# Patient Record
Sex: Male | Born: 1985 | Race: White | Hispanic: No | Marital: Single
Health system: Southern US, Community
[De-identification: ages and names within clinical notes are randomized; demographics above are authoritative.]

## PROBLEM LIST (undated history)

## (undated) HISTORY — PX: SHOULDER SURGERY: SHX246

---

## 2017-03-13 ENCOUNTER — Encounter (HOSPITAL_COMMUNITY): Payer: Self-pay

## 2017-03-13 DIAGNOSIS — Y998 Other external cause status: Secondary | ICD-10-CM | POA: Diagnosis not present

## 2017-03-13 DIAGNOSIS — Y9289 Other specified places as the place of occurrence of the external cause: Secondary | ICD-10-CM | POA: Diagnosis not present

## 2017-03-13 DIAGNOSIS — W268XXA Contact with other sharp object(s), not elsewhere classified, initial encounter: Secondary | ICD-10-CM | POA: Diagnosis not present

## 2017-03-13 DIAGNOSIS — S61315A Laceration without foreign body of left ring finger with damage to nail, initial encounter: Secondary | ICD-10-CM | POA: Insufficient documentation

## 2017-03-13 DIAGNOSIS — Y9389 Activity, other specified: Secondary | ICD-10-CM | POA: Diagnosis not present

## 2017-03-13 DIAGNOSIS — Z23 Encounter for immunization: Secondary | ICD-10-CM | POA: Diagnosis not present

## 2017-03-13 DIAGNOSIS — M79645 Pain in left finger(s): Secondary | ICD-10-CM | POA: Diagnosis present

## 2017-03-13 NOTE — ED Triage Notes (Signed)
Pt states that he cut his L pinky on door, last tetanus unknown. Laceration appx 1/2 in and into nailbed.

## 2017-03-14 ENCOUNTER — Emergency Department (HOSPITAL_COMMUNITY): Payer: Worker's Compensation

## 2017-03-14 ENCOUNTER — Emergency Department (HOSPITAL_COMMUNITY)
Admission: EM | Admit: 2017-03-14 | Discharge: 2017-03-14 | Disposition: A | Discharge status: Home / Self Care | Payer: Worker's Compensation | Attending: Emergency Medicine | Admitting: Emergency Medicine

## 2017-03-14 DIAGNOSIS — S61317A Laceration without foreign body of left little finger with damage to nail, initial encounter: Secondary | ICD-10-CM

## 2017-03-14 MED ORDER — LIDOCAINE HCL (PF) 1 % IJ SOLN
10.0000 mL | Freq: Once | INTRAMUSCULAR | Status: AC
  Administered 2017-03-14: 10 mL via INTRADERMAL
  Filled 2017-03-14: qty 10

## 2017-03-14 MED ORDER — SULFAMETHOXAZOLE-TRIMETHOPRIM 800-160 MG PO TABS: 1.0000 | ORAL_TABLET | Freq: Two times a day (BID) | ORAL | 0 refills | Status: AC

## 2017-03-14 MED ORDER — HYDROCODONE-ACETAMINOPHEN 5-325 MG PO TABS
2.0000 | ORAL_TABLET | ORAL | 0 refills | Status: AC | PRN
Start: 2017-03-14 — End: ?

## 2017-03-14 MED ORDER — IBUPROFEN 400 MG PO TABS
600.0000 mg | ORAL_TABLET | Freq: Once | ORAL | Status: AC
  Administered 2017-03-14: 03:00:00 600 mg via ORAL
  Filled 2017-03-14: qty 1

## 2017-03-14 MED ORDER — SULFAMETHOXAZOLE-TRIMETHOPRIM 800-160 MG PO TABS
1.0000 | ORAL_TABLET | Freq: Once | ORAL | Status: AC
  Administered 2017-03-14: 1 via ORAL
  Filled 2017-03-14: qty 1

## 2017-03-14 MED ORDER — TETANUS-DIPHTH-ACELL PERTUSSIS 5-2.5-18.5 LF-MCG/0.5 IM SUSP
0.5000 mL | Freq: Once | INTRAMUSCULAR | Status: AC
  Administered 2017-03-14: 0.5 mL via INTRAMUSCULAR
  Filled 2017-03-14: qty 0.5

## 2017-03-14 NOTE — ED Notes (Signed)
Pt taken to xray via wheelchair

## 2017-03-14 NOTE — ED Notes (Signed)
Pt brought back from x-ray via wheelchair.

## 2017-03-14 NOTE — Discharge Instructions (Addendum)
Follow-up with referred hand doctor.  Call his office and arrange to be seen as soon as possible.  Take antibiotics as instructed.  You can take the pain medication the first couple of days. You can take Tylenol or Ibuprofen as directed for pain after.  Return to the emergency department in 7-10 days for suture removal.  Return sooner if he experiencing any worsening pain, redness/swelling of the finger that extends on the hand, fever, any other worsening or concerning symptoms.  If you do not have a primary care doctor you see regularly, please you the list below. Please call them to arrange for follow-up.    No Primary Care Doctor Call [facility]  [phone] Other agencies that provide inexpensive medical care    [facility]  [phone]    [facility]  [phone]    [facility]  [phone]    [facility]  [phone]    [facility]  [phone]    [facility]  [phone]

## 2017-03-14 NOTE — ED Provider Notes (Signed)
[facility]-EMERGENCY DEPT Provider Note   CSN: [id number] Arrival date & time: 03/13/17  2304     History   Chief Complaint Chief Complaint  Patient presents with  . Finger Injury    HPI Michael Hahn is a 31 y.o. male who presents with Left fifth digit laceration That occurred earlier this afternoon. 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 He has not taken any medications prior to arrival. He does not know when his last tetanus shot was. He denies any numbness/weakness of the hand.  The history is provided by the patient.    History reviewed. No pertinent past medical history.  There are no active problems to display for this patient.   Past Surgical History:  Procedure Laterality Date  . SHOULDER SURGERY         Home Medications    Prior to Admission medications   Medication Sig Start Date End Date Taking? Authorizing Provider  HYDROcodone-acetaminophen (NORCO/VICODIN) 5-325 MG tablet Take 2 tablets by mouth every 4 (four) hours as needed. 03/14/17   Maxwell CaulLayden, Lindsey A, PA-C  sulfamethoxazole-trimethoprim (BACTRIM DS,SEPTRA DS) 800-160 MG tablet Take 1 tablet by mouth 2 (two) times daily. 03/14/17 03/21/17  Maxwell CaulLayden, Lindsey A, PA-C    Family History No family history on file.  Social History Social History  Substance Use Topics  . Smoking status: Not on file  . Smokeless tobacco: Not on file  . Alcohol use Not on file     Allergies   Patient has no known allergies.   Review of Systems Review of Systems  Skin: Positive for wound.  Neurological: Negative for weakness and numbness.     Physical Exam Updated Vital Signs BP 107/75 (BP Location: Right Arm)   Pulse 72   Temp 98.3 F (36.8 C) (Oral)   Resp 14   SpO2 100%   Physical Exam  Constitutional: He appears well-developed and well-nourished.  Sitting comfortably on examination table  HENT:  Head: Normocephalic and atraumatic.    Eyes: Conjunctivae and EOM are normal. Right eye exhibits no discharge. Left eye exhibits no discharge. No scleral icterus.  Cardiovascular:  Pulses:      Radial pulses are 2+ on the right side, and 2+ on the left side.  Pulmonary/Chest: Effort normal.  Musculoskeletal: He exhibits no deformity.  Full range of motion of left wrist. Flexion/extension of PIP and DIP joints of left fifth digit fully intact without difficulty. Good flexion extension at PIP and DIP joint when held in isolation. Patient is able to easily make a fist.  Neurological: He is alert.  Skin: Skin is warm and dry. Capillary refill takes less than 2 seconds.  3 cm linear laceration to the lateral aspect of the left pinky that extends to the nail of the left pinky. Nail is closely adhered to the nailbed.   Psychiatric: He has a normal mood and affect. His speech is normal and behavior is normal.  Nursing note and vitals reviewed.      ED Treatments / Results  Labs (all labs ordered are listed, but only abnormal results are displayed) Labs Reviewed - No data to display  EKG  EKG Interpretation None       Radiology Dg Hand Complete Left  Result Date: 03/14/2017 CLINICAL DATA:  Laceration to left fifth finger on door. Initial encounter. EXAM: LEFT HAND - COMPLETE 3+ VIEW COMPARISON:  None. FINDINGS: There is no evidence of  fracture or dislocation. The joint spaces are preserved. The carpal rows are intact, and demonstrate normal alignment. The known soft tissue laceration is not well characterized on radiograph. No radiopaque foreign bodies are seen. IMPRESSION: No evidence of fracture or dislocation. No radiopaque foreign bodies seen. Electronically Signed   By: Roanna Raider M.D.   On: 03/14/2017 00:35    Procedures .Marland KitchenLaceration Repair Date/Time: 03/14/2017 3:01 AM Performed by: Graciella Freer A Authorized by: Graciella Freer A   Consent:    Consent obtained:  Verbal   Consent given by:  Patient   Risks  discussed:  Infection, tendon damage and retained foreign body Anesthesia (see MAR for exact dosages):    Anesthesia method:  Local infiltration   Local anesthetic:  Lidocaine 1% w/o epi Laceration details:    Location:  Finger   Finger location:  L small finger   Length (cm):  3 Repair type:    Repair type:  Simple Pre-procedure details:    Preparation:  Patient was prepped and draped in usual sterile fashion Exploration:    Hemostasis achieved with:  Tourniquet   Wound exploration: wound explored through full range of motion and entire depth of wound probed and visualized   Treatment:    Area cleansed with:  Betadine and saline   Amount of cleaning:  Extensive   Irrigation solution:  Sterile saline   Irrigation method:  Syringe and tap   Visualized foreign bodies/material removed: no   Skin repair:    Repair method:  Sutures   Suture size:  6-0   Suture material:  Nylon   Suture technique:  Simple interrupted   Number of sutures:  8 Approximation:    Approximation:  Close   Vermilion border: well-aligned   Post-procedure details:    Dressing:  Sterile dressing   Patient tolerance of procedure:  Tolerated well, no immediate complications Comments:     After patient was anesthetized the wound was found under the faucet for thorough irrigation. Patient was thoroughly irrigated with additional sterile saline and Betadine. The site was prepped with ChloraPrep 2. Visualization of the laceration revealed no foreign bodies, no evidence of tendon damage. Wound was closely approximated with sutures. Patient had good range of motion throughout the entire procedure. Range of motion assessed after stitches in place. He has good flexion extension of the finger. Patient fitted with a metal splint to help provide support and stabilization.   (including critical care time)  Medications Ordered in ED Medications  lidocaine (PF) (XYLOCAINE) 1 % injection 10 mL (10 mLs Intradermal Given  03/14/17 0150)  Tdap (BOOSTRIX) injection 0.5 mL (0.5 mLs Intramuscular Given 03/14/17 0150)  sulfamethoxazole-trimethoprim (BACTRIM DS,SEPTRA DS) 800-160 MG per tablet 1 tablet (1 tablet Oral Given 03/14/17 0239)  ibuprofen (ADVIL,MOTRIN) tablet 600 mg (600 mg Oral Given 03/14/17 0239)     Initial Impression / Assessment and Plan / ED Course  I have reviewed the triage vital signs and the nursing notes.  Pertinent labs & imaging results that were available during my care of the patient were reviewed by me and considered in my medical decision making (see chart for details).     31 year old male who presents with left pinky laceration that occurred this afternoon. Patient is afebrile, non-toxic appearing, sitting comfortably on examination table. Patient is neurovascularly intact. Patient does not know his last tetanus was. Will update Tetanus in the department. X-rays ordered for evaluation of fracture or dislocation.  X-ray reviewed. Negative for any acute fracture. We'll  plan to repair in the department.  Wound extensively cleaned and examined. No obvious tendon involvement. No foreign body visualized. Patient has good range of motion of finger.  Laceration repaired as documented above. Patient given strict wound care instructions. Patient instructed to follow-up with the referred hand doctor in 24-48 hours. Advised him to make an appointment as soon as possible for further evaluation. Will plan to discharge home with a short course of pain medications to help with symptomatically. Will also plan to discharge with antibiotics for coverage. Dose of antiemetics given in the department. Strict return precautions discussed. Patient expresses understanding and agreement.  Final Clinical Impressions(s) / ED Diagnoses   Final diagnoses:  Laceration of left little finger without foreign body with damage to nail, initial encounter    New Prescriptions Discharge Medication List as of 03/14/2017   2:58 AM    START taking these medications   Details  HYDROcodone-acetaminophen (NORCO/VICODIN) 5-325 MG tablet Take 2 tablets by mouth every 4 (four) hours as needed., Starting Sun 03/14/2017, Print    sulfamethoxazole-trimethoprim (BACTRIM DS,SEPTRA DS) 800-160 MG tablet Take 1 tablet by mouth 2 (two) times daily., Starting Sun 03/14/2017, Until Sun 03/21/2017, Print         Maxwell Caul, PA-C 03/14/17 1610    Linwood Dibbles, MD 03/14/17 [phone]

## 2018-03-24 IMAGING — CR DG HAND COMPLETE 3+V*L*
3 series · 3 of 3 positions shown · non-contrast
Comparison: None.

CLINICAL DATA: Laceration to left fifth finger on door. Initial
encounter.

EXAM:
LEFT HAND - COMPLETE 3+ VIEW

[hand pa]
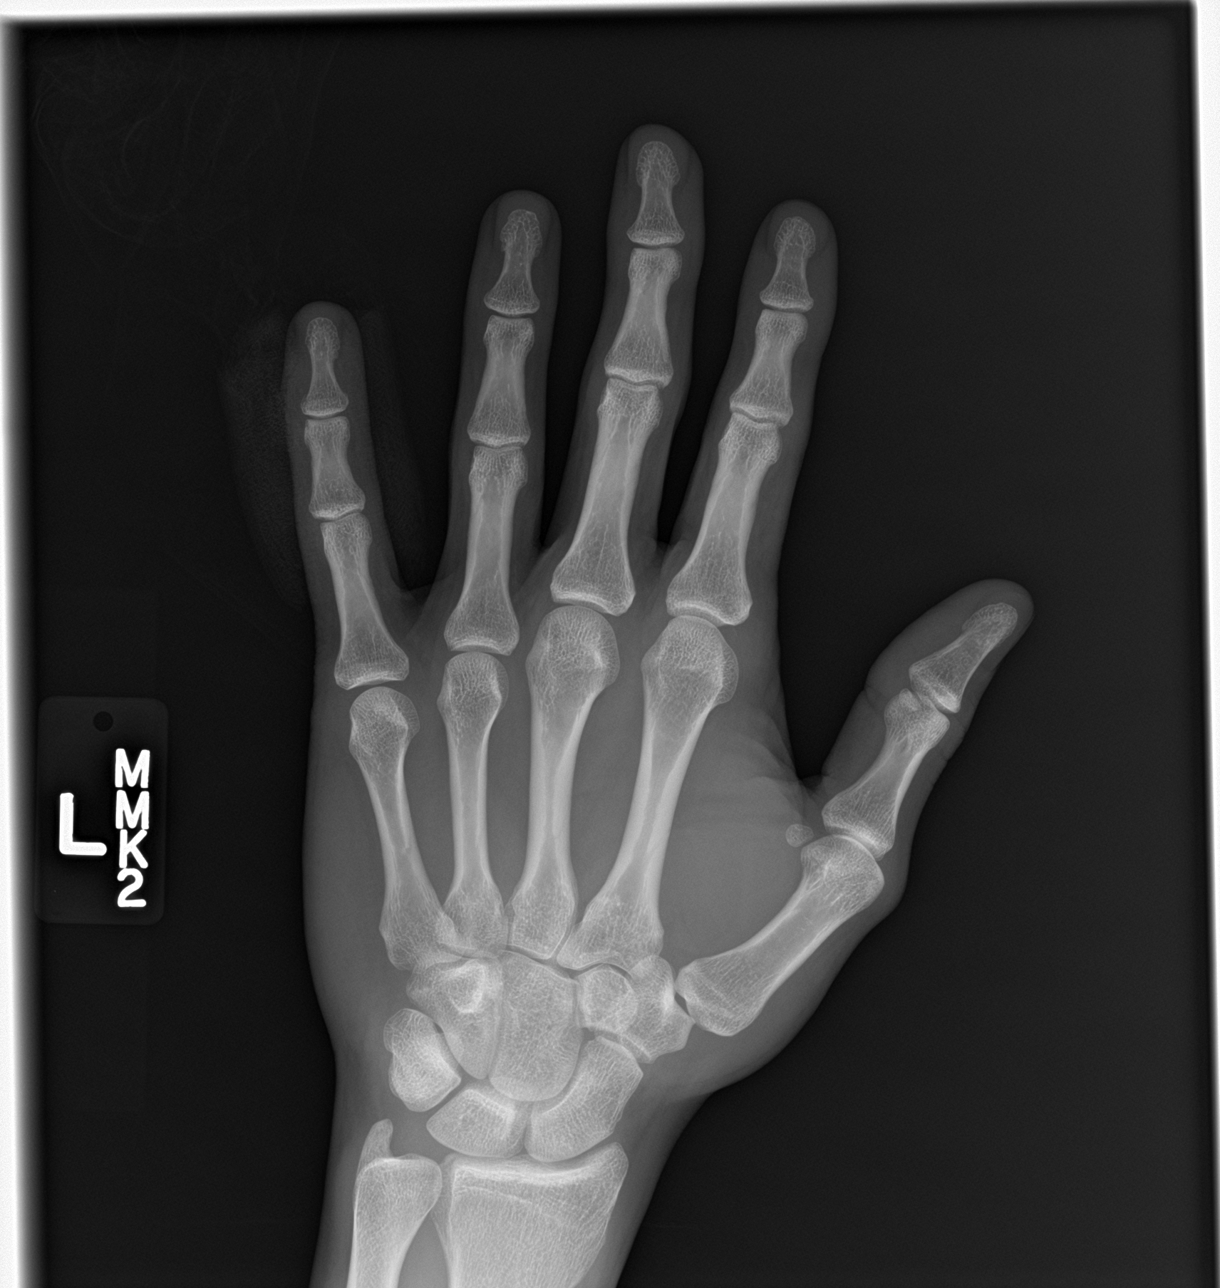

[hand obl]
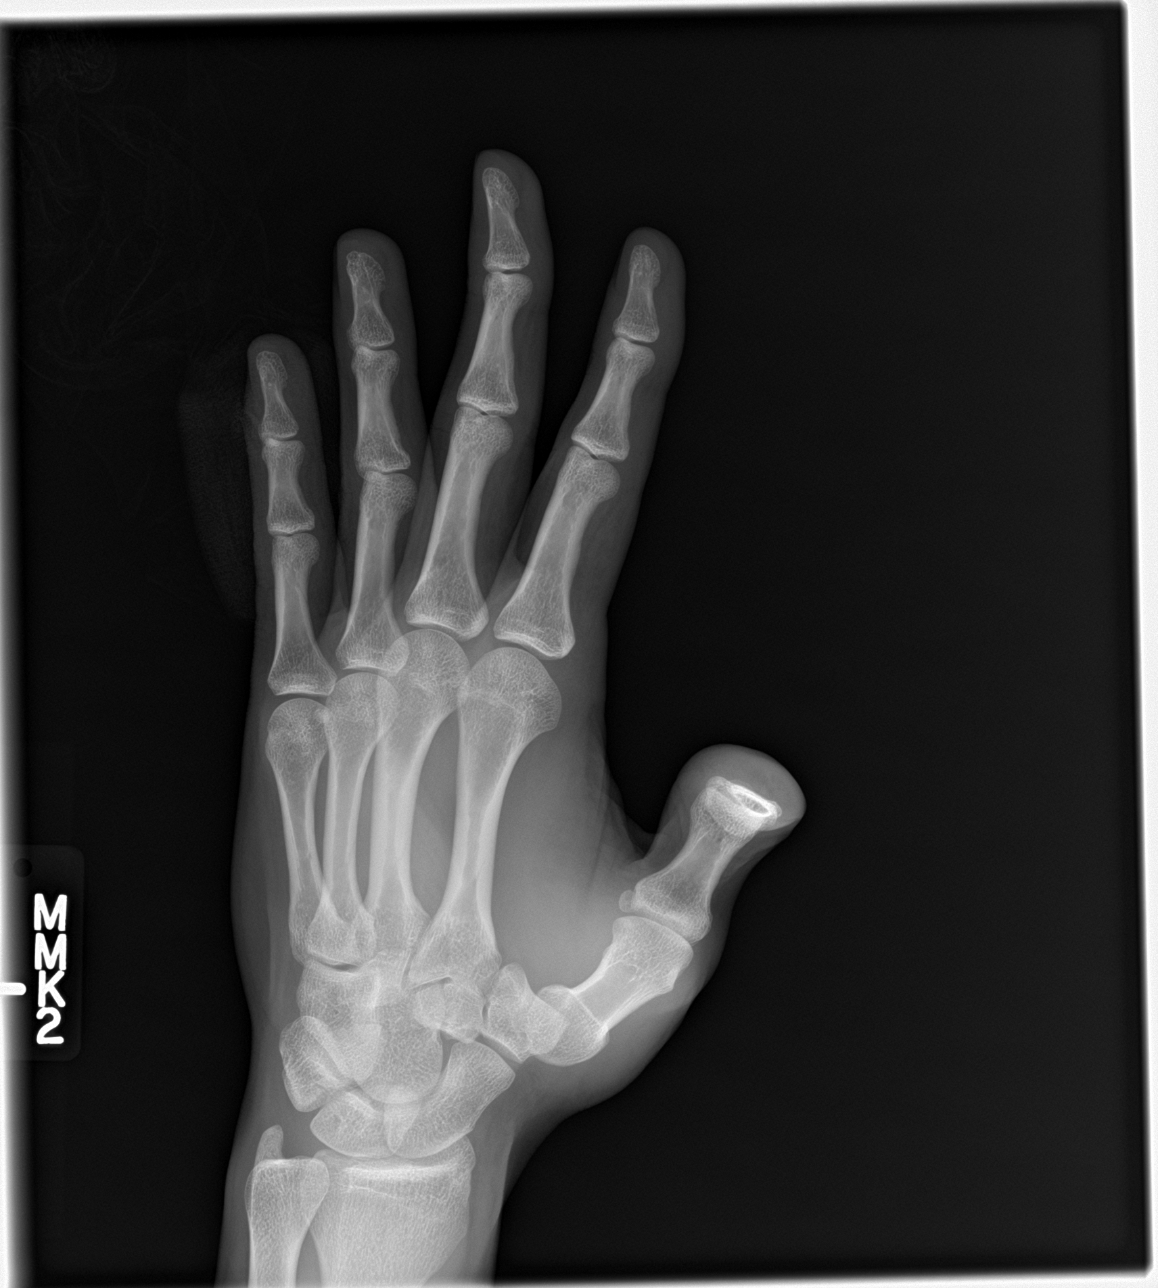

[hand lat]
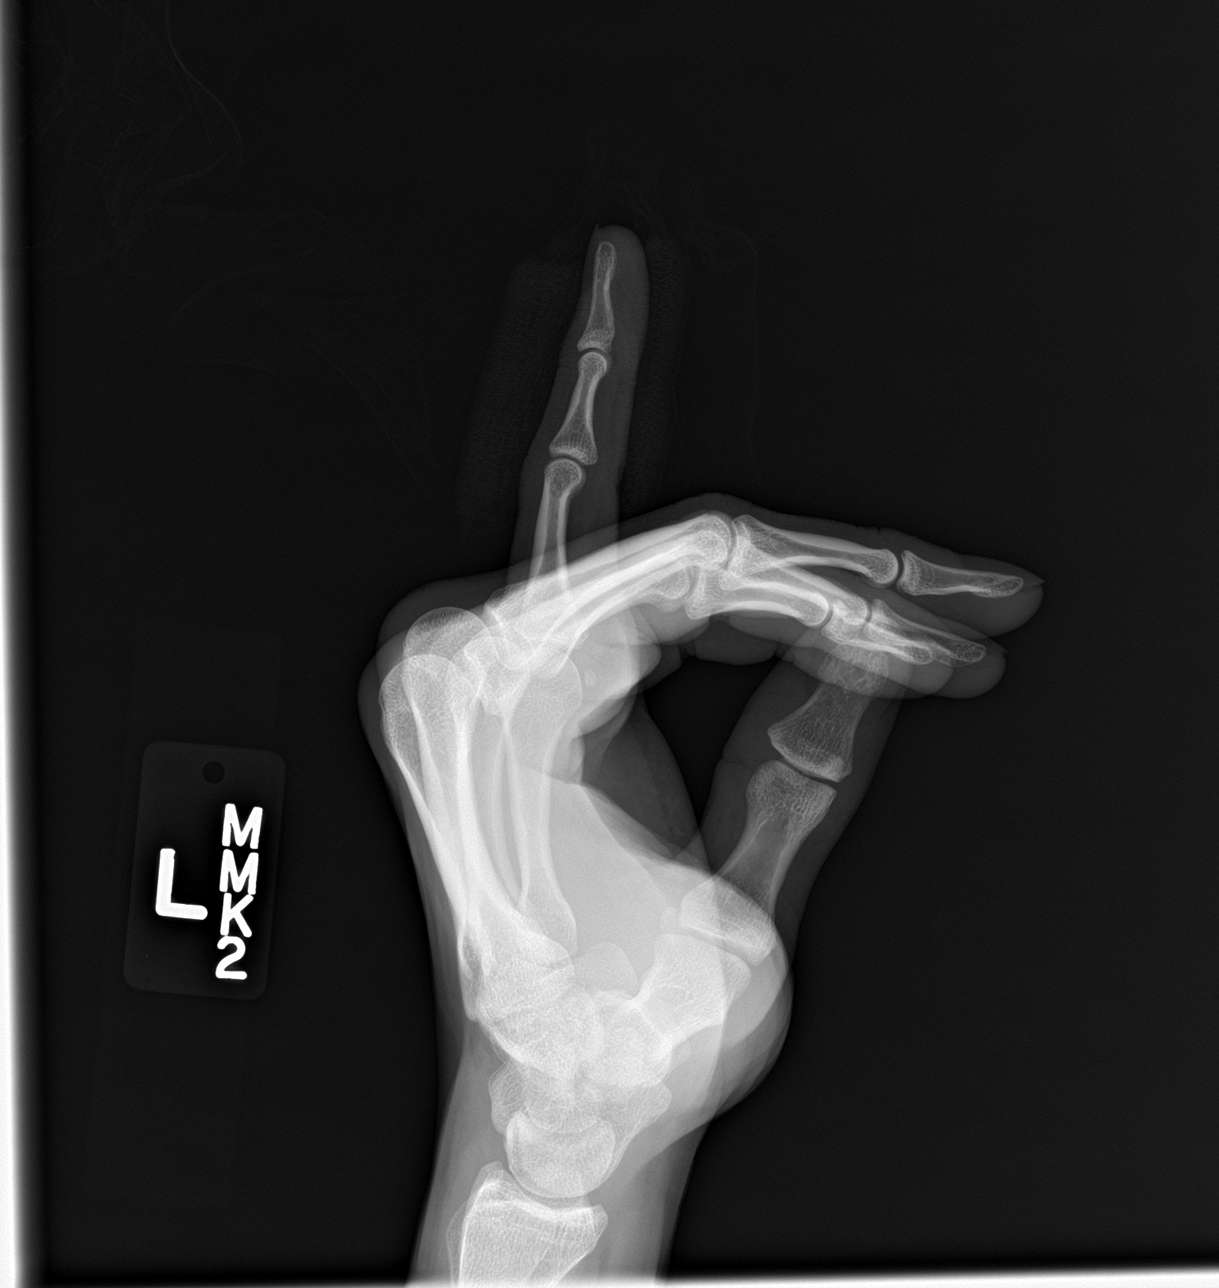

[3 of 3 positions shown; findings below may reference images not displayed]

FINDINGS: There is no evidence of fracture or dislocation. The joint spaces
are preserved. The carpal rows are intact, and demonstrate normal
alignment. The known soft tissue laceration is not well
characterized on radiograph. No radiopaque foreign bodies are seen.
IMPRESSION: No evidence of fracture or dislocation. No radiopaque foreign bodies
seen.
